# Patient Record
Sex: Female | Born: 1950 | Race: White | Hispanic: No | Marital: Married | Smoking: Never smoker
Health system: Southern US, Community
[De-identification: ages and names within clinical notes are randomized; demographics above are authoritative.]

## PROBLEM LIST (undated history)

## (undated) DIAGNOSIS — N329 Bladder disorder, unspecified: Secondary | ICD-10-CM

## (undated) DIAGNOSIS — M1990 Unspecified osteoarthritis, unspecified site: Secondary | ICD-10-CM

## (undated) HISTORY — PX: RETINAL DETACHMENT SURGERY: SHX105

## (undated) HISTORY — DX: Bladder disorder, unspecified: N32.9

## (undated) HISTORY — DX: Unspecified osteoarthritis, unspecified site: M19.90

---

## 2015-02-26 ENCOUNTER — Ambulatory Visit: Payer: BLUE CROSS/BLUE SHIELD | Admitting: Rehabilitation

## 2015-03-11 ENCOUNTER — Ambulatory Visit: Payer: BLUE CROSS/BLUE SHIELD | Admitting: Physical Therapy

## 2015-04-10 ENCOUNTER — Ambulatory Visit (INDEPENDENT_AMBULATORY_CARE_PROVIDER_SITE_OTHER): Payer: BLUE CROSS/BLUE SHIELD | Admitting: Psychology

## 2015-04-10 DIAGNOSIS — F4322 Adjustment disorder with anxiety: Secondary | ICD-10-CM | POA: Diagnosis not present

## 2015-04-17 ENCOUNTER — Ambulatory Visit: Payer: BLUE CROSS/BLUE SHIELD | Admitting: Psychology

## 2015-12-03 ENCOUNTER — Encounter: Payer: Self-pay | Admitting: Podiatry

## 2015-12-03 ENCOUNTER — Ambulatory Visit (INDEPENDENT_AMBULATORY_CARE_PROVIDER_SITE_OTHER): Payer: BLUE CROSS/BLUE SHIELD | Admitting: Podiatry

## 2015-12-03 ENCOUNTER — Ambulatory Visit (HOSPITAL_BASED_OUTPATIENT_CLINIC_OR_DEPARTMENT_OTHER)
Admission: RE | Admit: 2015-12-03 | Discharge: 2015-12-03 | Disposition: A | Discharge status: Home / Self Care | Payer: BLUE CROSS/BLUE SHIELD | Source: Ambulatory Visit | Attending: Podiatry | Admitting: Podiatry

## 2015-12-03 DIAGNOSIS — M79671 Pain in right foot: Secondary | ICD-10-CM | POA: Diagnosis not present

## 2015-12-03 DIAGNOSIS — M79672 Pain in left foot: Secondary | ICD-10-CM | POA: Diagnosis not present

## 2015-12-03 DIAGNOSIS — R52 Pain, unspecified: Secondary | ICD-10-CM | POA: Diagnosis present

## 2015-12-03 DIAGNOSIS — M7730 Calcaneal spur, unspecified foot: Secondary | ICD-10-CM | POA: Diagnosis not present

## 2015-12-03 DIAGNOSIS — M7661 Achilles tendinitis, right leg: Secondary | ICD-10-CM | POA: Diagnosis not present

## 2015-12-03 DIAGNOSIS — M7662 Achilles tendinitis, left leg: Secondary | ICD-10-CM

## 2015-12-03 MED ORDER — MELOXICAM 15 MG PO TABS: 15.0000 mg | ORAL_TABLET | Freq: Every day | ORAL | Status: DC

## 2015-12-03 NOTE — Progress Notes (Signed)
Subjective:    Patient ID: Christine ArbourKatherine Welch, female    DOB: 11-Oct-1950, 65 y.o.   MRN: [id number]  HPI  65 year old female presents to the office today with concerns of pain to her left foot which is also starting on her right foot in the same area. On the left foot this has been ongoing for about 2 years. She was diagnosed with a bone spur and given a " walking boot" which she wore. She did well until 6 months ago when the pain started to come back and is not going up the back for which she points to the achilles tendon. She also gets some swelling to the left side about 6 months. She has been icing and elevated and wearing the boot which helps some but she still gets pain. More recently the right side hs started to do the same. No injury. No redness or warmth the feet. No tingling or numbness.   Review of Systems  All other systems reviewed and are negative.      Objective:   Physical Exam General: AAO x3, NAD  Dermatological: Skin is warm, dry and supple bilateral. Nails x 10 are well manicured; remaining integument appears unremarkable at this time. There are no open sores, no preulcerative lesions, no rash or signs of infection present.  Vascular: Dorsalis Pedis artery and Posterior Tibial artery pedal pulses are 2/4 bilateral with immedate capillary fill time. Pedal hair growth present. No varicosities. There is swelling to the outside back of the achilles tendon on the left. There is no pain with calf compression, swelling, warmth, erythema.   Neruologic: Grossly intact via light touch bilateral. Vibratory intact via tuning fork bilateral. Protective threshold with Semmes Wienstein monofilament intact to all pedal sites bilateral. Patellar and Achilles deep tendon reflexes 2+ bilateral. No Babinski or clonus noted bilateral.   Musculoskeletal: There is tenderness palpation of the posterior aspect of the left calcaneus at the site of a prominent retrocalcaneal exostosis on the left  side worse than the right. There is some mild discomfort on the course the Achilles tendon distally have there is no defect noted within the Achilles tendon and Thompson test is negative bilaterally. There is no pain with medial to lateral compression of the calcaneus. Along the medial and lateral aspect of the distal portion Achilles tendon is swelling on the left side without any overlying erythema or increase in warmth. Equinus is present. There is tenderness of the Achilles tendon upon dorsiflexion. There are some mild discomfort on the plantar medial tubercle the calcaneal insertion of the plantar fashion the left side worse in the right however this is mild and not causing her any discomfort when walking or in the mornings. Point tender upon palpation. MMT 5/5.  Gait: Unassisted, Nonantalgic.      Assessment & Plan:  65 year-old female Achilles tendinitis, retrocalcaneal exostosis. -Treatment options discussed including all alternatives, risks, and complications -Etiology of symptoms were discussed -X-rays were ordered and reviewed. -At this time recommended to start some stretching exercises daily. Also dispensed night splint. Prescribed meloxicam discussed side effects the medicine. I discussed Medrol Dosepak however she states that she does not want to do this as she had one about 6 months ago for knee and did not help. I discussed possible physical therapy at next appointment if she can fuse to have symptoms. Also discussed surgical intervention. She wishes to hold off on any surgical adventure this time. Discussed orthotics. -Follow-up in 3 weeks or sooner if any  problems arise. In the meantime, encouraged to call the office with any questions, concerns, change in symptoms.   Ovid Curd, DPM

## 2015-12-03 NOTE — Patient Instructions (Signed)

## 2015-12-24 ENCOUNTER — Ambulatory Visit: Payer: BLUE CROSS/BLUE SHIELD | Admitting: Podiatry

## 2016-01-07 ENCOUNTER — Encounter: Payer: Self-pay | Admitting: Podiatry

## 2016-01-07 ENCOUNTER — Ambulatory Visit (INDEPENDENT_AMBULATORY_CARE_PROVIDER_SITE_OTHER): Payer: BLUE CROSS/BLUE SHIELD | Admitting: Podiatry

## 2016-01-07 DIAGNOSIS — M7730 Calcaneal spur, unspecified foot: Secondary | ICD-10-CM

## 2016-01-07 DIAGNOSIS — M7662 Achilles tendinitis, left leg: Secondary | ICD-10-CM | POA: Diagnosis not present

## 2016-01-07 DIAGNOSIS — M7661 Achilles tendinitis, right leg: Secondary | ICD-10-CM | POA: Diagnosis not present

## 2016-01-08 NOTE — Progress Notes (Signed)
Patient ID: Marguarite ArbourKatherine Vandeventer, female   DOB: 1951-03-07, 65 y.o.   MRN: [id number]  Subjective: 65 year old female presents the office they for follow-up evaluation of left Achilles tendinitis, retrocalcaneal exostosis. She does feel that she has made some good improvement compared to last appointment however she discontinued have some pain to the area. She's been stretching as well as when a night splint. Also icing to the area.Denies any systemic complaints such as fevers, chills, nausea, vomiting. No acute changes since last appointment, and no other complaints at this time.   Objective: AAO x3, NAD DP/PT pulses palpable bilaterally, CRT less than 3 seconds There is continued tenderness along the posterior aspect of the calcaneus on the insertion of the Achilles tendon or along a prominent retrocalcaneal exostosis of the left side worse than the right. Thompson test is negative. No amount edema, erythema. No pain with lateral compression of the calcaneus. No other areas of pinpoint bony tenderness. No areas of pinpoint bony tenderness or pain with vibratory sensation. MMT 5/5, ROM WNL. No edema, erythema, increase in warmth to bilateral lower extremities.  No open lesions or pre-ulcerative lesions.  No pain with calf compression, swelling, warmth, erythema  Assessment: Improving Achilles tendinitis, retrocalcaneal exostosis  Plan: -All treatment options discussed with the patient including all alternatives, risks, complications.  -This time a discussed physical therapy however she declined. Order compound cream for anti-inflammatory. Offloading pad/talk was given to the patient. Continue stretching, icing. Continue night splint. Discussed supportive shoe gear and I discussed orthotics. -Follow-up as scheduled. -Patient encouraged to call the office with any questions, concerns, change in symptoms.   Ovid CurdMatthew Haidyn Kilburg, DPM

## 2016-01-09 MED ORDER — NONFORMULARY OR COMPOUNDED ITEM: Status: AC

## 2016-01-09 NOTE — Addendum Note (Signed)
Addended by: Alphia Kava'CONNELL, Yichen Gilardi D on: 01/09/2016 10:35 AM   Modules accepted: Orders

## 2016-02-04 ENCOUNTER — Ambulatory Visit: Payer: BLUE CROSS/BLUE SHIELD | Admitting: Podiatry

## 2016-02-09 ENCOUNTER — Telehealth: Payer: Self-pay | Admitting: *Deleted

## 2016-02-09 NOTE — Telephone Encounter (Signed)
Pt states the mail pharmacy called for insurance information, but never sent the cream.  Raoul PitchS[facility] - [facility] states they have called pt twice but have never spoken with her, they need her rx card number to process the prescription.  I informed pt of [facility]'s statement and pt states she hasn't received any calls, so I gave [facility] callback line.

## 2016-02-24 ENCOUNTER — Other Ambulatory Visit: Payer: Self-pay | Admitting: Podiatry

## 2016-02-27 ENCOUNTER — Other Ambulatory Visit: Payer: Self-pay

## 2016-03-26 ENCOUNTER — Other Ambulatory Visit: Payer: Self-pay | Admitting: Podiatry

## 2016-03-26 NOTE — Telephone Encounter (Signed)
Pt needs an appt prior to future refills. 

## 2016-06-01 ENCOUNTER — Ambulatory Visit (INDEPENDENT_AMBULATORY_CARE_PROVIDER_SITE_OTHER): Payer: BLUE CROSS/BLUE SHIELD | Admitting: Podiatry

## 2016-06-01 ENCOUNTER — Encounter: Payer: Self-pay | Admitting: Podiatry

## 2016-06-01 DIAGNOSIS — M7661 Achilles tendinitis, right leg: Secondary | ICD-10-CM

## 2016-06-01 DIAGNOSIS — M7730 Calcaneal spur, unspecified foot: Secondary | ICD-10-CM

## 2016-06-01 DIAGNOSIS — M7662 Achilles tendinitis, left leg: Secondary | ICD-10-CM

## 2016-06-01 MED ORDER — MELOXICAM 15 MG PO TABS: 15.0000 mg | ORAL_TABLET | Freq: Every day | ORAL | 2 refills | Status: AC

## 2016-06-02 NOTE — Progress Notes (Signed)
Patient ID: Marguarite ArbourKatherine Vincelette, female   DOB: [birth year], 65 y.o.   MRN: [id number]  Subjective: 65 year old female presents the office they for follow-up evaluation of left Achilles tendinitis, retrocalcaneal exostosis. If that she is still having pain. She's been using offloading pad as well as stretching, changing shoes, icing. She states the meloxicam and accommodation with Tylenol helps her pain. She's not had significant improvement in her symptoms. She is also one to be having a coming right hip replacement in the near future and she'll if her left heel to to feel better before proceeding with hip surgery. No acute changes since last appointment, and no other complaints at this time.   Objective: AAO x3, NAD DP/PT pulses palpable bilaterally, CRT less than 3 seconds There is continued tenderness along the posterior aspect of the calcaneus on the insertion of the Achilles tendon or along a prominent retrocalcaneal exostosis of the left side worse than the right. Thompson test is negative. No significant edema, erythema. No pain with lateral compression of the calcaneus. No other areas of pinpoint bony tenderness. No areas of pinpoint bony tenderness or pain with vibratory sensation. MMT 5/5, ROM WNL. No edema, erythema, increase in warmth to bilateral lower extremities.  No open lesions or pre-ulcerative lesions.  No pain with calf compression, swelling, warmth, erythema  Assessment: Improving Achilles tendinitis, retrocalcaneal exostosis  Plan: -All treatment options discussed with the patient including all alternatives, risks, complications.  -As she has attempted shoe changes, stretching, icing, offloading pads without any relief other than meloxicam we will go ahead and start physical therapy. Prescription today was provided for this. I also discussed with her surgical intervention. She was told upon surgery. I also offered a steroid injection but she wishes to hold off as she was reading  online side effects. -Follow-up at her physical therapy or sooner if needed.  Ovid CurdMatthew Wellington Winegarden, DPM

## 2016-06-15 ENCOUNTER — Ambulatory Visit: Payer: BLUE CROSS/BLUE SHIELD | Admitting: Podiatry

## 2017-03-06 IMAGING — DX DG FOOT COMPLETE 3+V*R*
1 series · 1 of 1 positions shown · non-contrast
Comparison: None.

CLINICAL DATA: Foot pain for 1 month, no known injury, initial
encounter

EXAM:
RIGHT FOOT COMPLETE - 3+ VIEW

[foot obl]
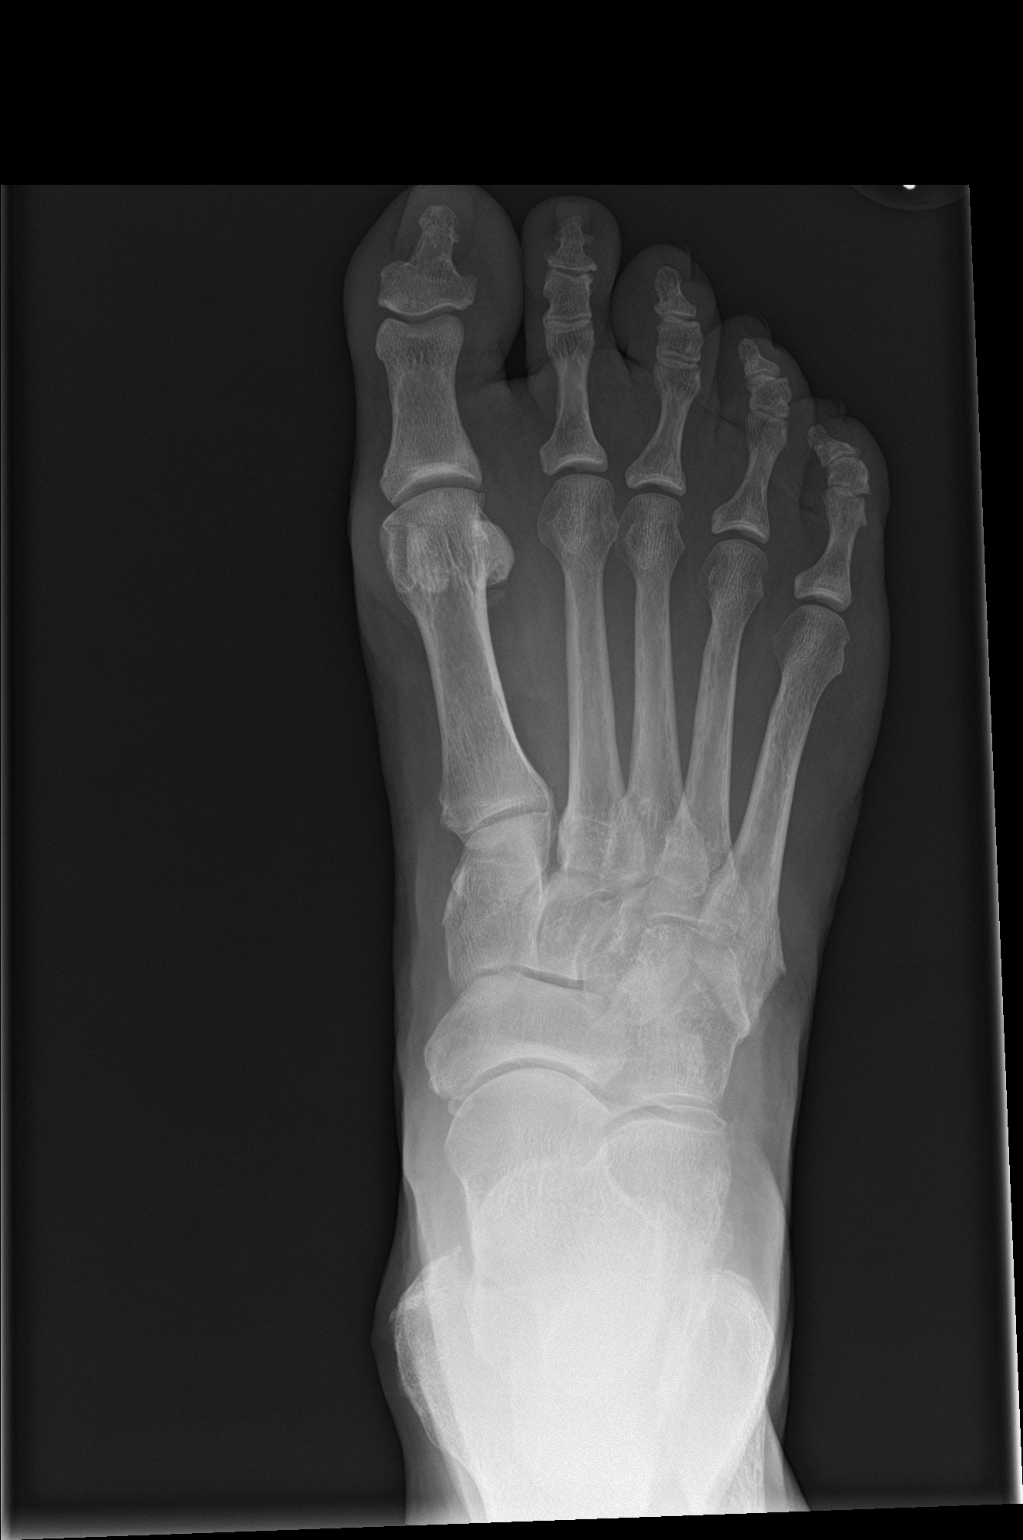

[1 of 1 positions shown; findings below may reference images not displayed]

FINDINGS: There is no evidence of fracture or dislocation. No soft tissue
abnormality is noted. Small calcaneal spurs are seen. Some
ligamentous calcification is noted laterally.
IMPRESSION: No acute abnormality seen.

## 2017-03-06 IMAGING — DX DG FOOT COMPLETE 3+V*L*
3 series · 3 of 3 positions shown · non-contrast
Comparison: None.

CLINICAL DATA: Left foot pain, no known injury, initial encounter

EXAM:
LEFT FOOT - COMPLETE 3+ VIEW

[foot ap]
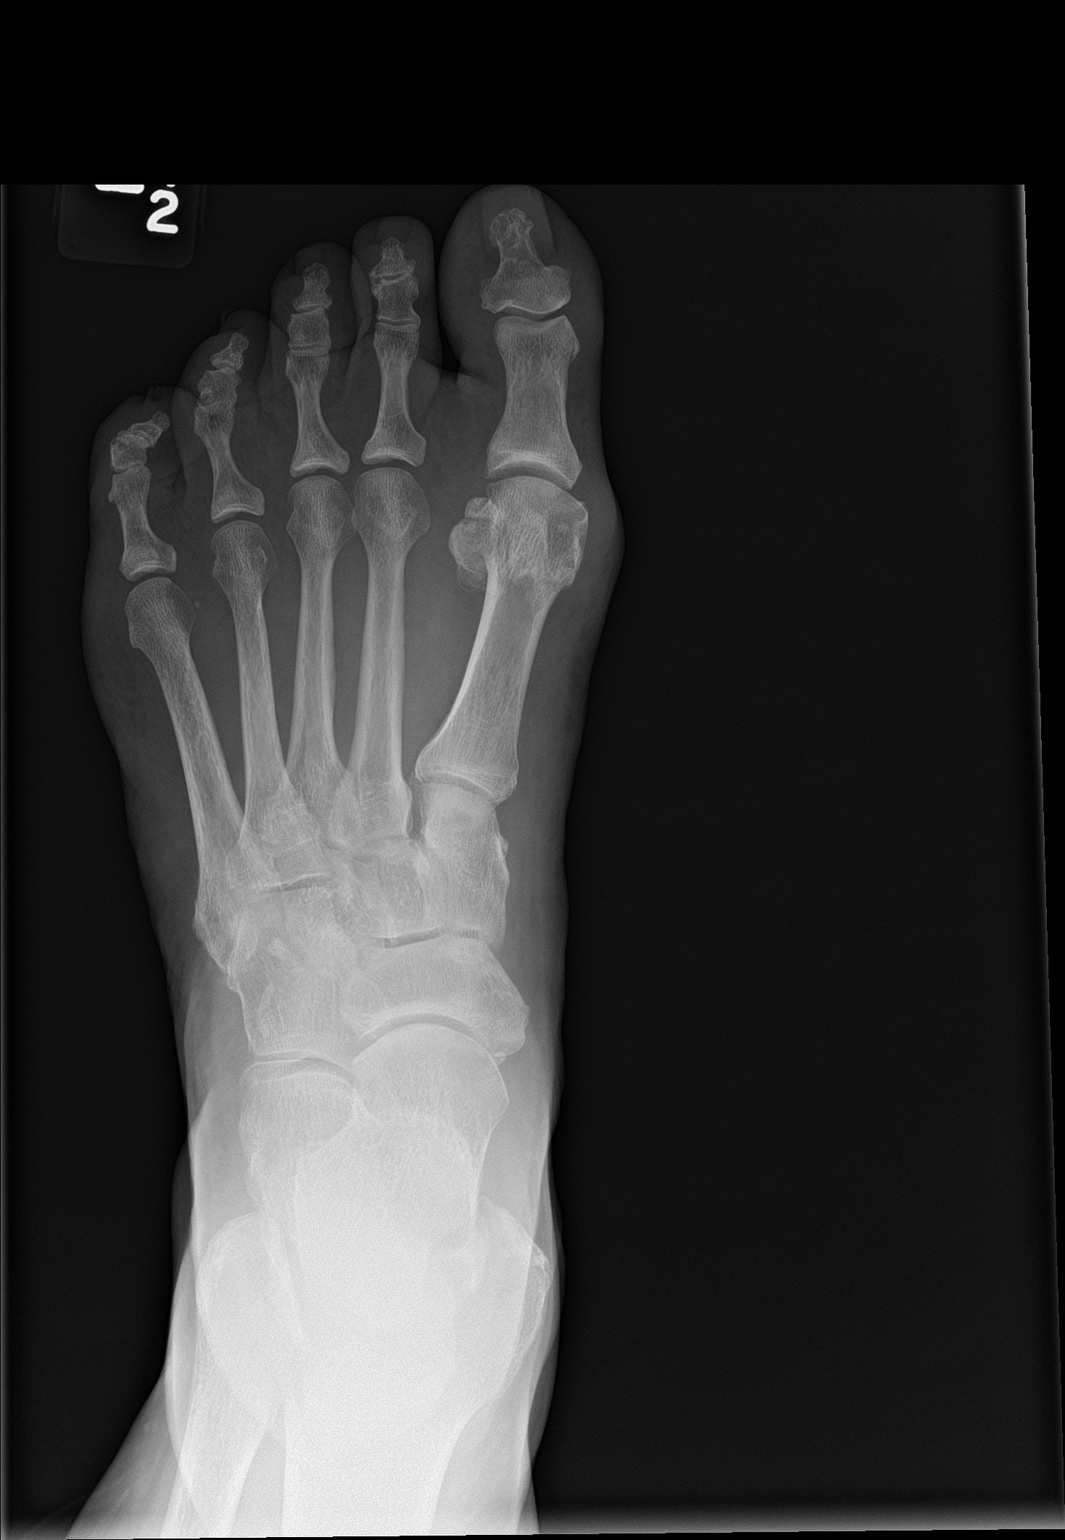

[foot obl]
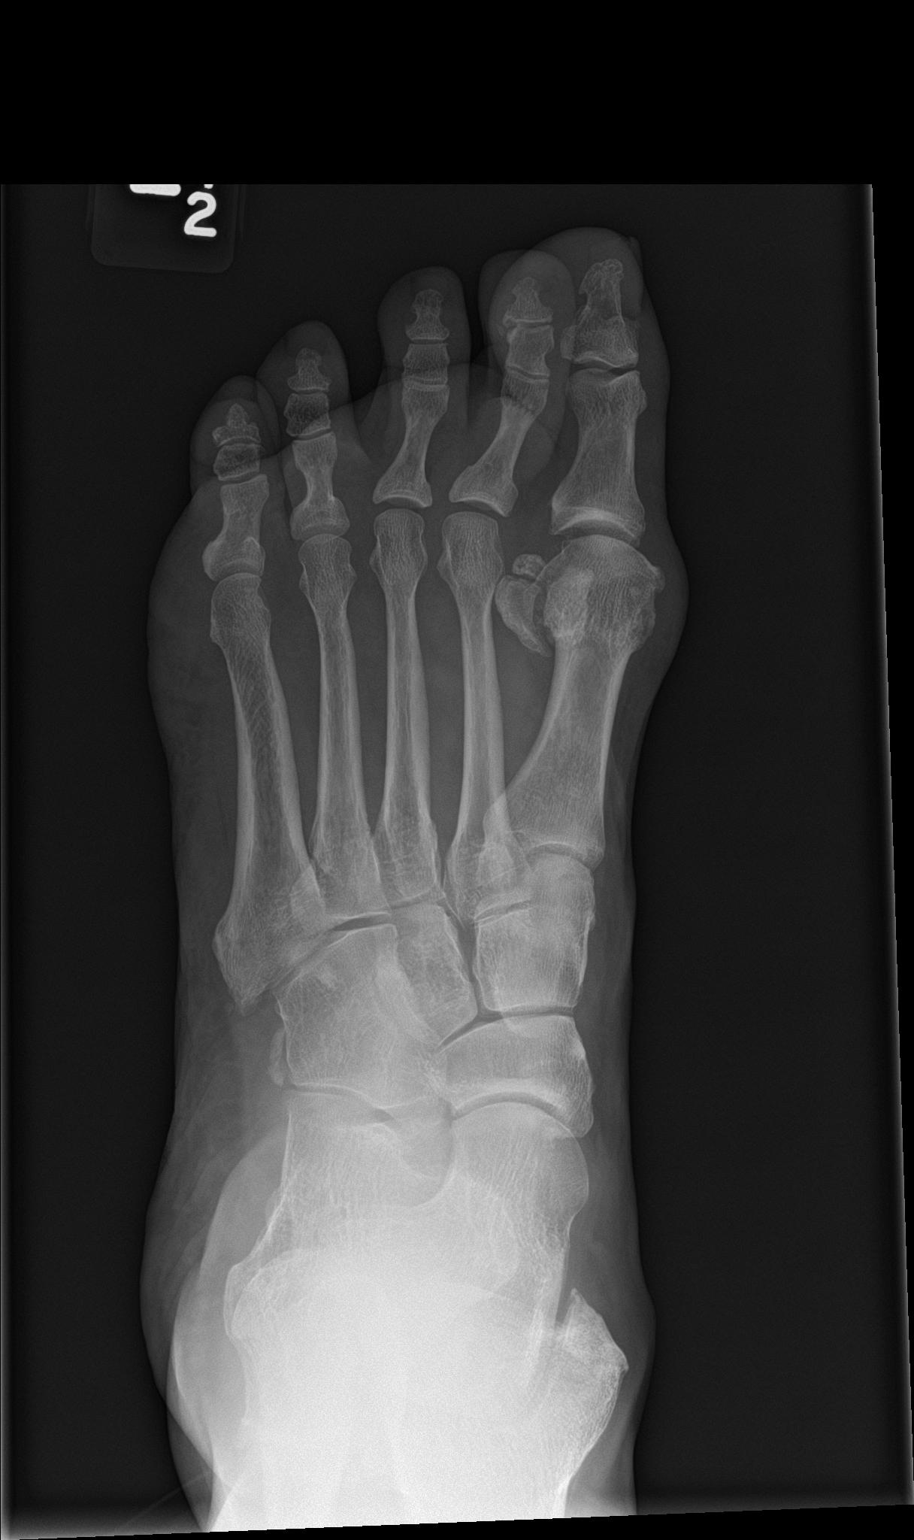

[foot lat]
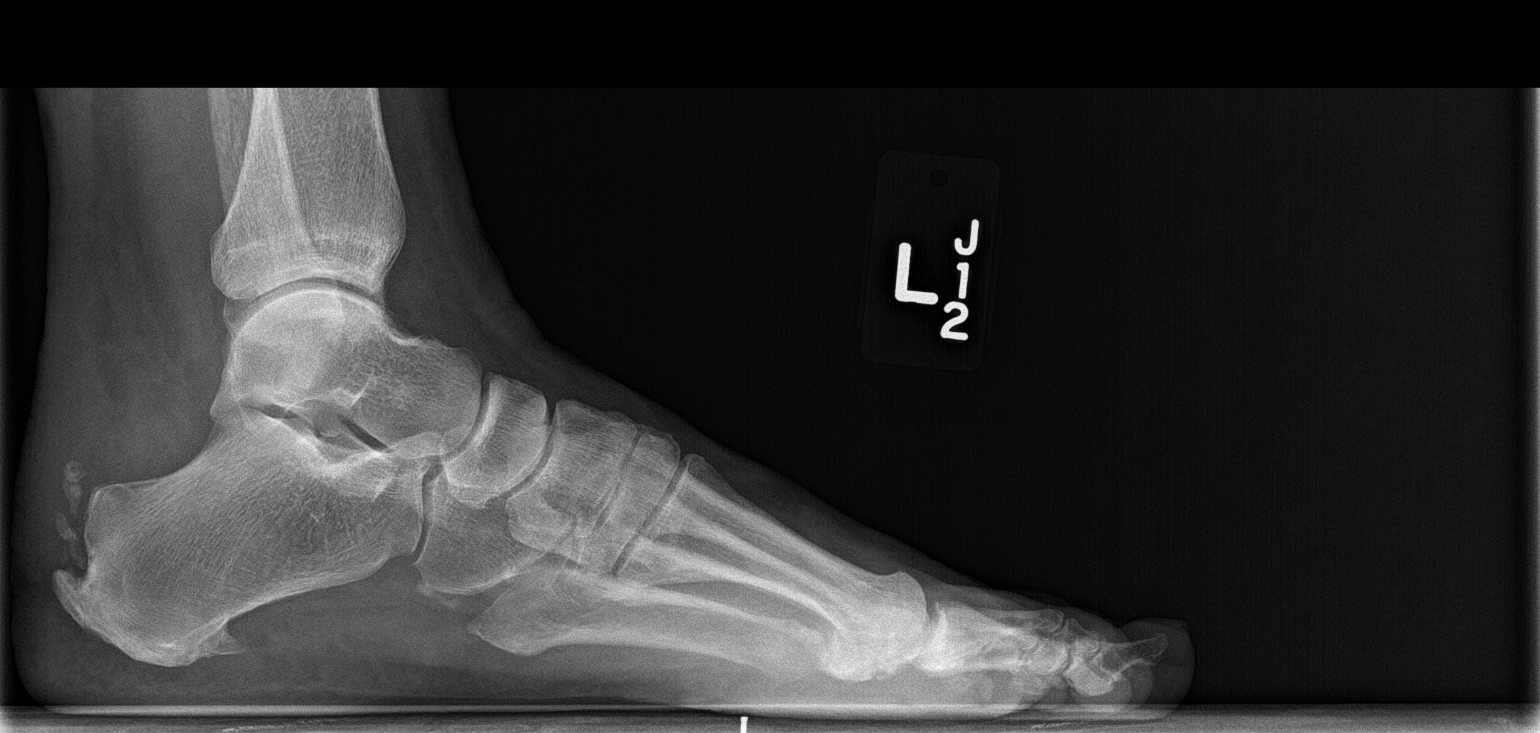

[3 of 3 positions shown; findings below may reference images not displayed]

FINDINGS: Calcaneal spurring is noted. No acute fracture or dislocation is
seen. No gross soft tissue abnormality is noted.
IMPRESSION: No acute abnormality seen.

## 2019-09-12 ENCOUNTER — Ambulatory Visit: Payer: Self-pay

## 2019-09-21 ENCOUNTER — Ambulatory Visit: Payer: Self-pay

## 2019-09-29 ENCOUNTER — Ambulatory Visit: Payer: Self-pay | Attending: Internal Medicine

## 2019-09-29 ENCOUNTER — Ambulatory Visit: Payer: Self-pay

## 2019-09-29 DIAGNOSIS — Z23 Encounter for immunization: Secondary | ICD-10-CM | POA: Insufficient documentation

## 2019-09-29 NOTE — Progress Notes (Signed)
   Covid-19 Vaccination Clinic  Name:  Christine Welch    MRN: [id number] DOB: 1951-06-21  09/29/2019  [name] was observed post Covid-19 immunization for 15 minutes without incidence. She was provided with Vaccine Information Sheet and instruction to access the V-Safe system.   [name] was instructed to call 911 with any severe reactions post vaccine: Marland Kitchen Difficulty breathing  . Swelling of your face and throat  . A fast heartbeat  . A bad rash all over your body  . Dizziness and weakness    Immunizations Administered    Name Date Dose VIS Date Route   Pfizer COVID-19 Vaccine 09/29/2019  3:03 PM 0.3 mL 08/03/2019 Intramuscular   Manufacturer: [employer], [employer]   Lot: [id number]   NDC: [address]-2

## 2019-10-12 ENCOUNTER — Ambulatory Visit: Payer: Self-pay

## 2019-10-24 ENCOUNTER — Ambulatory Visit: Payer: Self-pay | Attending: Internal Medicine

## 2019-10-24 DIAGNOSIS — Z23 Encounter for immunization: Secondary | ICD-10-CM | POA: Insufficient documentation

## 2019-10-24 NOTE — Progress Notes (Signed)
   Covid-19 Vaccination Clinic  Name:  Christine Welch    MRN: [id number] DOB: 1951-03-17  10/24/2019  [name] was observed post Covid-19 immunization for 15 minutes without incident. She was provided with Vaccine Information Sheet and instruction to access the V-Safe system.   [name] was instructed to call 911 with any severe reactions post vaccine: Marland Kitchen Difficulty breathing  . Swelling of face and throat  . A fast heartbeat  . A bad rash all over body  . Dizziness and weakness   Immunizations Administered    Name Date Dose VIS Date Route   Pfizer COVID-19 Vaccine 10/24/2019 11:09 AM 0.3 mL 08/03/2019 Intramuscular   Manufacturer: [employer], [employer]   Lot: [id number]   NDC: [address]-6

## 2020-08-01 ENCOUNTER — Ambulatory Visit (INDEPENDENT_AMBULATORY_CARE_PROVIDER_SITE_OTHER): Payer: Medicare Other | Admitting: Psychiatry

## 2020-08-01 ENCOUNTER — Other Ambulatory Visit: Payer: Self-pay

## 2020-08-01 DIAGNOSIS — F4323 Adjustment disorder with mixed anxiety and depressed mood: Secondary | ICD-10-CM

## 2020-08-01 MED ORDER — CLONAZEPAM 0.5 MG PO TABS: ORAL_TABLET | ORAL | 3 refills | Status: DC

## 2020-08-01 NOTE — Progress Notes (Signed)
Psychiatric Initial Adult Assessment   Patient Identification: Christine Welch MRN:  [id number] Date of Evaluation:  08/01/2020 Referral Source:  Chief Complaint:   Visit Diagnosis: Adjustment disorder  History of Present Illness:    This patient is a 69 year old white married mother who is presenting with excessive anxiety.  Her anxiety is obsessive in nature.  The patient is happily married for 30 years in her second marriage.  She has 2 children one a 76 year old daughter who is married with 2 children and a son who is 19 years old single and lives in [place].  Her son is the issue.  The patient is extremely worried and anxious about his wellbeing.  He is not married he is a physical therapist and apparently he is obsessed with body building.  According to the patient he was traumatized as a younger person being 5 football player in high school.  Since that is been building his bottom and spends hours every day doing it.  Patient is very worried about his wellbeing.  The patient's first husband she describes as being narcissistic was very toxic and is out of everyone's life at this time.  This patient denies daily depression.  She actually is sleeping only fairly well.  She has no dysfunction during the day.  Her appetite is good as is her energy.  She has no problems thinking and concentrating.  She denies worthlessness.  She is not suicidal now and never has been.  She denies any use of alcohol or drugs.  She has never experienced psychotic symptoms.  She has never experienced an episode of major depression.  She denies any manic-like symptoms.  She denies any specific anxiety symptoms consistent with generalized anxiety disorder, panic disorder obsessive-compulsive disorder or social phobia.  Patient is a retired Production designer, theatre/television/film for [employer].  She is been retired for many years.  Patient has a fairly close relationship with her daughter and the patient spends every day with her 2 granddaughters ages  83 and 71.  The patient's physical health is good.  The patient has no past psychiatric history at all.  She has never been in therapy seen a psychiatrist.  At this time her primary care doctor has given her Zoloft 25 mg for reasons that are not clear.  She was taking Xanax on a as needed basis but no longer is not available for her.  She had a change of her primary care doctor and her new primary care doctor refuses to give her anything like Xanax.  She was offered BuSpar but she takes it around it erratically.  Patient says she is extremely anxious in regards to her son.  She cannot stop thinking about it.  She always catastrophize is thinking the worst thing.  She gets so anxious she cannot wait to hear from him and she calls very often.  Interestingly he does not tend to work and even calls her at times when he is got some problems.  Overall though she clearly describes it as relationship where she is hassling him and calling him too much.  She feels out of control.  She will go online and attempt to stalking it.  If she cannot account for his whereabouts she thinks worse.  She clearly is obsessed.  She shows no evidence of psychosis and once again there is no real evidence of OCD.  I suspect there is some degree of family dynamics related to her first husband.  Associated Signs/Symptoms: Depression Symptoms:  anxiety, (Hypo)  Manic Symptoms:   Anxiety Symptoms:  Excessive Worry, Psychotic Symptoms:   PTSD Symptoms:   Past Psychiatric History: Zoloft 25 mg  Previous Psychotropic Medications: No   Substance Abuse History in the last 12 months:  No.  Consequences of Substance Abuse:   Past Medical History:  Past Medical History:  Diagnosis Date  . Arthritis   . Bladder problem     Past Surgical History:  Procedure Laterality Date  . RETINAL DETACHMENT SURGERY      Family Psychiatric History:   Family History: No family history on file.  Social History:   Social History    Socioeconomic History  . Marital status: Married    Spouse name: Not on file  . Number of children: Not on file  . Years of education: Not on file  . Highest education level: Not on file  Occupational History  . Not on file  Tobacco Use  . Smoking status: Never Smoker  . Smokeless tobacco: Never Used  Substance and Sexual Activity  . Alcohol use: No    Alcohol/week: 0.0 standard drinks  . Drug use: No  . Sexual activity: Not on file  Other Topics Concern  . Not on file  Social History Narrative  . Not on file   Social Determinants of Health   Financial Resource Strain: Not on file  Food Insecurity: Not on file  Transportation Needs: Not on file  Physical Activity: Not on file  Stress: Not on file  Social Connections: Not on file    Additional Social History:   Allergies:   Allergies  Allergen Reactions  . Sulfur Rash    Breathing  issues  . Cefadroxil   . Ciprofloxacin   . Levofloxacin   . Nitrofurantoin   . Sulfa Antibiotics     Metabolic Disorder Labs: No results found for: HGBA1C, MPG No results found for: PROLACTIN No results found for: CHOL, TRIG, HDL, CHOLHDL, VLDL, LDLCALC No results found for: TSH  Therapeutic Level Labs: No results found for: LITHIUM No results found for: CBMZ No results found for: VALPROATE  Current Medications: Current Outpatient Medications  Medication Sig Dispense Refill  . ALPRAZolam (XANAX) 0.25 MG tablet Take 0.25 mg by mouth.    . ALPRAZolam (XANAX) 0.25 MG tablet TAKE 1 TABLET BY MOUTH NIGHTLY AS NEEDED FOR SLEEP  5  . clonazePAM (KLONOPIN) 0.5 MG tablet 1  Bid  1 PRN 70 tablet 3  . diclofenac sodium (VOLTAREN) 1 % GEL Place onto the skin.    . meloxicam (MOBIC) 15 MG tablet TAKE 1 TABLET (15 MG TOTAL) BY MOUTH DAILY. 30 tablet 0  . NONFORMULARY OR COMPOUNDED ITEM [facility] compound:  Antiinflammatory cream - Diclofenac 3%, Baclofen 2%, Cyclobenzaprine 2%, Lidocaine 2%, dispense 120 grams, apply 1-2 grams  to affected area 3-4 times daily, +2refills. 120 each 2  . sertraline (ZOLOFT) 25 MG tablet Take 25 mg by mouth.    . sertraline (ZOLOFT) 25 MG tablet Take 25 mg by mouth daily.  1  . solifenacin (VESICARE) 5 MG tablet     . traMADol (ULTRAM) 50 MG tablet Take by mouth.     No current facility-administered medications for this visit.    Musculoskeletal: Strength & Muscle Tone: within normal limits Gait & Station: normal Patient leans: N/A  Psychiatric Specialty Exam: Review of Systems  There were no vitals taken for this visit.There is no height or weight on file to calculate BMI.  General Appearance: Casual  Eye Contact:  Good  Speech:  Clear and Coherent  Volume:  Normal  Mood:  Anxious  Affect:  NA  Thought Process:  Coherent  Orientation:  Full (Time, Place, and Person)  Thought Content:  WDL  Suicidal Thoughts:  No  Homicidal Thoughts:  No  Memory:  Negative  Judgement:  Good  Insight:  Fair  Psychomotor Activity:  Normal  Concentration:    Recall:  Good  Fund of Knowledge:Good  Language: Good  Akathisia:  No  Handed:  Right  AIMS (if indicated):  not done  Assets:  Desire for Improvement  ADL's:  Intact  Cognition: WNL  Sleep:  Fair   Screenings:   Assessment and Plan: Today it is evident this patient is experiencing adjustment disorder with an anxious mood state.  I do not think there is any specific anxiety disorder.  I think is probably related to her son and her relationship with him.  The patient is much less anxious with other family members.  At this time I interventions are to ask her to stop all the other psychotropic medicines including 25 mg of Zoloft or BuSpar.  Patient will begin on Klonopin 0.5 mg twice daily with 1 extra as needed.  Most importantly the patient will be given the number to Dr. Tor Netters to begin talking therapy.  This patient is not suicidal.  She is actually functioning very well.  This patient will return to see me in  approximately 7 to 8 weeks.  The possibility of increasing her Klonopin will be considered at that point.   Gypsy Balsam, MD 12/10/202110:26 AM

## 2020-09-08 ENCOUNTER — Ambulatory Visit (INDEPENDENT_AMBULATORY_CARE_PROVIDER_SITE_OTHER): Payer: Medicare Other | Admitting: Psychology

## 2020-09-08 DIAGNOSIS — F429 Obsessive-compulsive disorder, unspecified: Secondary | ICD-10-CM

## 2020-09-15 ENCOUNTER — Ambulatory Visit (INDEPENDENT_AMBULATORY_CARE_PROVIDER_SITE_OTHER): Payer: Medicare Other | Admitting: Psychology

## 2020-09-15 DIAGNOSIS — F429 Obsessive-compulsive disorder, unspecified: Secondary | ICD-10-CM | POA: Diagnosis not present

## 2020-09-19 ENCOUNTER — Other Ambulatory Visit: Payer: Self-pay

## 2020-09-19 ENCOUNTER — Ambulatory Visit (INDEPENDENT_AMBULATORY_CARE_PROVIDER_SITE_OTHER): Payer: Medicare Other | Admitting: Psychiatry

## 2020-09-19 DIAGNOSIS — F4323 Adjustment disorder with mixed anxiety and depressed mood: Secondary | ICD-10-CM | POA: Diagnosis not present

## 2020-09-19 MED ORDER — CLONAZEPAM 0.5 MG PO TABS: ORAL_TABLET | ORAL | 3 refills | Status: AC

## 2020-09-19 MED ORDER — CLONAZEPAM 0.5 MG PO TABS: ORAL_TABLET | ORAL | 3 refills | Status: DC

## 2020-09-19 NOTE — Progress Notes (Signed)
Psychiatric Initial Adult Assessment   Patient Identification: Christine Welch MRN:  [id number] Date of Evaluation:  09/19/2020 Referral Source:  Chief Complaint:   Visit Diagnosis: Adjustment disorder  History of Present Illnes  Today the patient is about her baseline.  She is a small amount better and that she is still obsessed and thinking about her son, Christine Welch who lives in [place].  She constantly thinks about negative scenarios and catastrophes.  Fortunately is not so bad that it does not quite affect her as much when she tries to go to bed at night.  Not with the Klonopin has been helpful.  She goes right off to sleep and does not ruminate as much at night.  Nonetheless she still has constant worries and anxiety.  To the point where it affects her life on a day-to-day basis.  Her husband is often saying to her can we stop talking about her son.  Note is the patient does well with her daughter and their 2 grandchildren.  Noticed that she does not really worry about very much else.  She denies daily depression.  She drinks no alcohol uses no drugs.  Generally she is healthy.  She did make contact with Dr. Evalina Field but did not really connect.  She also found out that this therapist is retiring in a few months.  Associated Signs/Symptoms: Depression Symptoms:  anxiety, (Hypo) Manic Symptoms:   Anxiety Symptoms:  Excessive Worry, Psychotic Symptoms:   PTSD Symptoms:   Past Psychiatric History: Zoloft 25 mg  Previous Psychotropic Medications: No   Substance Abuse History in the last 12 months:  No.  Consequences of Substance Abuse:   Past Medical History:  Past Medical History:  Diagnosis Date  . Arthritis   . Bladder problem     Past Surgical History:  Procedure Laterality Date  . RETINAL DETACHMENT SURGERY      Family Psychiatric History:   Family History: No family history on file.  Social History:   Social History   Socioeconomic History  . Marital status: Married     Spouse name: Not on file  . Number of children: Not on file  . Years of education: Not on file  . Highest education level: Not on file  Occupational History  . Not on file  Tobacco Use  . Smoking status: Never Smoker  . Smokeless tobacco: Never Used  Substance and Sexual Activity  . Alcohol use: No    Alcohol/week: 0.0 standard drinks  . Drug use: No  . Sexual activity: Not on file  Other Topics Concern  . Not on file  Social History Narrative  . Not on file   Social Determinants of Health   Financial Resource Strain: Not on file  Food Insecurity: Not on file  Transportation Needs: Not on file  Physical Activity: Not on file  Stress: Not on file  Social Connections: Not on file    Additional Social History:   Allergies:   Allergies  Allergen Reactions  . Elemental Sulfur Rash    Breathing  issues  . Cefadroxil   . Ciprofloxacin   . Levofloxacin   . Nitrofurantoin   . Sulfa Antibiotics     Metabolic Disorder Labs: No results found for: HGBA1C, MPG No results found for: PROLACTIN No results found for: CHOL, TRIG, HDL, CHOLHDL, VLDL, LDLCALC No results found for: TSH  Therapeutic Level Labs: No results found for: LITHIUM No results found for: CBMZ No results found for: VALPROATE  Current Medications: Current  Outpatient Medications  Medication Sig Dispense Refill  . ALPRAZolam (XANAX) 0.25 MG tablet Take 0.25 mg by mouth.    . ALPRAZolam (XANAX) 0.25 MG tablet TAKE 1 TABLET BY MOUTH NIGHTLY AS NEEDED FOR SLEEP  5  . clonazePAM (KLONOPIN) 0.5 MG tablet 1  Bid  1 PRN 70 tablet 3  . diclofenac sodium (VOLTAREN) 1 % GEL Place onto the skin.    . meloxicam (MOBIC) 15 MG tablet TAKE 1 TABLET (15 MG TOTAL) BY MOUTH DAILY. 30 tablet 0  . NONFORMULARY OR COMPOUNDED ITEM [facility] compound:  Antiinflammatory cream - Diclofenac 3%, Baclofen 2%, Cyclobenzaprine 2%, Lidocaine 2%, dispense 120 grams, apply 1-2 grams to affected area 3-4 times daily, +2refills.  120 each 2  . sertraline (ZOLOFT) 25 MG tablet Take 25 mg by mouth.    . sertraline (ZOLOFT) 25 MG tablet Take 25 mg by mouth daily.  1  . solifenacin (VESICARE) 5 MG tablet     . traMADol (ULTRAM) 50 MG tablet Take by mouth.     No current facility-administered medications for this visit.    Musculoskeletal: Strength & Muscle Tone: within normal limits Gait & Station: normal Patient leans: N/A  Psychiatric Specialty Exam: Review of Systems  There were no vitals taken for this visit.There is no height or weight on file to calculate BMI.  General Appearance: Casual  Eye Contact:  Good  Speech:  Clear and Coherent  Volume:  Normal  Mood:  Anxious  Affect:  NA  Thought Process:  Coherent  Orientation:  Full (Time, Place, and Person)  Thought Content:  WDL  Suicidal Thoughts:  No  Homicidal Thoughts:  No  Memory:  Negative  Judgement:  Good  Insight:  Fair  Psychomotor Activity:  Normal  Concentration:    Recall:  Good  Fund of Knowledge:Good  Language: Good  Akathisia:  No  Handed:  Right  AIMS (if indicated):  not done  Assets:  Desire for Improvement  ADL's:  Intact  Cognition: WNL  Sleep:  Fair   Screenings:   Assessment and Plan  At this time I believe the best diagnosis is an adjustment disorder with an anxious mood state.  Exactly why this patient has become so obsessed and so worried about her son's not clear.  The possibility of the pandemic has something to do with it is not out of the question.  Her other family members seem to be fairly stable.  Nonetheless she seems to be obsessed about her son Christine Welch who is a successful physical therapist and who she described as being very smart and independent.  He does have some medical problems like a detached retina but other than that it seems to be helping.  There is no clear rational reason why this patient is obsessed about his wellbeing.  At this time we will continue her Klonopin 0.5 mg twice daily with 1 extra as  needed.  We will continue this over the next 3 to 6 months but on her next visit if she is not better and I doubt she will be will begin here on BuSpar in addition to the Klonopin.  Once she is on the BuSpar and the Klonopin we will then titrate her off of the Klonopin.  Today we will give [facility]  I shared with the patient that we will do everything we have to do to help her with her anxiety.  Her next  visit again will consider adding BuSpar.  I think this is a long-term problem possibly and therefore I do not want to continue Klonopin on a long-term basis.  Gypsy Balsam, MD 1/28/202211:49 AM

## 2020-09-22 ENCOUNTER — Ambulatory Visit (INDEPENDENT_AMBULATORY_CARE_PROVIDER_SITE_OTHER): Payer: Medicare Other | Admitting: Psychology

## 2020-09-22 DIAGNOSIS — F429 Obsessive-compulsive disorder, unspecified: Secondary | ICD-10-CM | POA: Diagnosis not present

## 2020-10-17 ENCOUNTER — Other Ambulatory Visit (HOSPITAL_COMMUNITY): Payer: Self-pay | Admitting: Psychiatry

## 2020-10-17 ENCOUNTER — Other Ambulatory Visit (HOSPITAL_COMMUNITY): Payer: Self-pay | Admitting: *Deleted

## 2020-10-17 MED ORDER — BUSPIRONE HCL 10 MG PO TABS: 10.0000 mg | ORAL_TABLET | Freq: Two times a day (BID) | ORAL | 3 refills | Status: AC

## 2020-11-25 ENCOUNTER — Telehealth (HOSPITAL_COMMUNITY): Payer: Medicare Other | Admitting: Psychiatry

## 2021-01-13 ENCOUNTER — Other Ambulatory Visit (HOSPITAL_COMMUNITY): Payer: Self-pay | Admitting: Psychiatry

## 2021-02-08 ENCOUNTER — Other Ambulatory Visit (HOSPITAL_COMMUNITY): Payer: Self-pay | Admitting: Psychiatry
